# Patient Record
Sex: Male | Born: 1990 | Race: Black or African American | Hispanic: No | Marital: Single | Smoking: Never smoker
Health system: Southern US, Community
[De-identification: ages and names within clinical notes are randomized; demographics above are authoritative.]

## PROBLEM LIST (undated history)

## (undated) DIAGNOSIS — S8290XA Unspecified fracture of unspecified lower leg, initial encounter for closed fracture: Secondary | ICD-10-CM

## (undated) DIAGNOSIS — S8990XA Unspecified injury of unspecified lower leg, initial encounter: Secondary | ICD-10-CM

## (undated) DIAGNOSIS — Z973 Presence of spectacles and contact lenses: Secondary | ICD-10-CM

## (undated) HISTORY — PX: NO PAST SURGERIES: SHX2092

---
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
## 2015-11-16 ENCOUNTER — Emergency Department: Payer: 59

## 2015-11-16 ENCOUNTER — Emergency Department
Admission: EM | Admit: 2015-11-16 | Discharge: 2015-11-16 | Disposition: A | Discharge status: Home / Self Care | Payer: 59 | Attending: Emergency Medicine | Admitting: Emergency Medicine

## 2015-11-16 DIAGNOSIS — S86912A Strain of unspecified muscle(s) and tendon(s) at lower leg level, left leg, initial encounter: Secondary | ICD-10-CM | POA: Diagnosis not present

## 2015-11-16 DIAGNOSIS — S79922A Unspecified injury of left thigh, initial encounter: Secondary | ICD-10-CM | POA: Insufficient documentation

## 2015-11-16 DIAGNOSIS — Y998 Other external cause status: Secondary | ICD-10-CM | POA: Diagnosis not present

## 2015-11-16 DIAGNOSIS — R1032 Left lower quadrant pain: Secondary | ICD-10-CM

## 2015-11-16 DIAGNOSIS — Y92410 Unspecified street and highway as the place of occurrence of the external cause: Secondary | ICD-10-CM | POA: Insufficient documentation

## 2015-11-16 DIAGNOSIS — Y9389 Activity, other specified: Secondary | ICD-10-CM | POA: Diagnosis not present

## 2015-11-16 DIAGNOSIS — S8992XA Unspecified injury of left lower leg, initial encounter: Secondary | ICD-10-CM | POA: Diagnosis present

## 2015-11-16 MED ORDER — CYCLOBENZAPRINE HCL 5 MG PO TABS: 5.0000 mg | ORAL_TABLET | Freq: Three times a day (TID) | ORAL | Status: DC | PRN

## 2015-11-16 MED ORDER — CYCLOBENZAPRINE HCL 10 MG PO TABS
5.0000 mg | ORAL_TABLET | Freq: Once | ORAL | Status: AC
  Administered 2015-11-16: 5 mg via ORAL
  Filled 2015-11-16: qty 1

## 2015-11-16 MED ORDER — HYDROCODONE-ACETAMINOPHEN 5-325 MG PO TABS
1.0000 | ORAL_TABLET | Freq: Once | ORAL | Status: AC
  Administered 2015-11-16: 1 via ORAL
  Filled 2015-11-16: qty 1

## 2015-11-16 MED ORDER — HYDROCODONE-ACETAMINOPHEN 5-325 MG PO TABS: 1.0000 | ORAL_TABLET | Freq: Three times a day (TID) | ORAL | Status: DC | PRN

## 2015-11-16 MED ORDER — NAPROXEN 500 MG PO TBEC: 500.0000 mg | DELAYED_RELEASE_TABLET | Freq: Two times a day (BID) | ORAL | Status: DC
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
## 2015-11-20 ENCOUNTER — Ambulatory Visit: Payer: Self-pay | Admitting: Family Medicine

## 2015-11-20 ENCOUNTER — Ambulatory Visit (INDEPENDENT_AMBULATORY_CARE_PROVIDER_SITE_OTHER): Payer: 59

## 2015-11-20 ENCOUNTER — Ambulatory Visit
Admission: EM | Admit: 2015-11-20 | Discharge: 2015-11-20 | Disposition: A | Discharge status: Home / Self Care | Payer: 59 | Attending: Family Medicine | Admitting: Family Medicine

## 2015-11-20 ENCOUNTER — Encounter: Payer: Self-pay | Admitting: Emergency Medicine

## 2015-11-20 DIAGNOSIS — S338XXD Sprain of other parts of lumbar spine and pelvis, subsequent encounter: Secondary | ICD-10-CM | POA: Diagnosis not present

## 2015-11-20 DIAGNOSIS — T148 Other injury of unspecified body region: Secondary | ICD-10-CM | POA: Diagnosis not present

## 2015-11-20 DIAGNOSIS — S39012D Strain of muscle, fascia and tendon of lower back, subsequent encounter: Secondary | ICD-10-CM

## 2015-11-20 DIAGNOSIS — S29009D Unspecified injury of muscle and tendon of unspecified wall of thorax, subsequent encounter: Secondary | ICD-10-CM

## 2015-11-20 DIAGNOSIS — S29019D Strain of muscle and tendon of unspecified wall of thorax, subsequent encounter: Secondary | ICD-10-CM

## 2015-11-20 DIAGNOSIS — T148XXA Other injury of unspecified body region, initial encounter: Secondary | ICD-10-CM

## 2015-11-20 HISTORY — DX: Unspecified fracture of unspecified lower leg, initial encounter for closed fracture: S82.90XA

## 2015-11-20 MED ORDER — CYCLOBENZAPRINE HCL 5 MG PO TABS: 5.0000 mg | ORAL_TABLET | Freq: Three times a day (TID) | ORAL | Status: DC | PRN

## 2015-11-20 MED ORDER — MELOXICAM 15 MG PO TABS: 15.0000 mg | ORAL_TABLET | Freq: Every day | ORAL | Status: DC | PRN
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
## 2015-12-10 ENCOUNTER — Other Ambulatory Visit: Payer: Self-pay | Admitting: Surgery

## 2015-12-10 DIAGNOSIS — M25562 Pain in left knee: Secondary | ICD-10-CM

## 2015-12-12 ENCOUNTER — Ambulatory Visit
Admission: RE | Admit: 2015-12-12 | Discharge: 2015-12-12 | Disposition: A | Discharge status: Home / Self Care | Payer: 59 | Source: Ambulatory Visit | Attending: Surgery | Admitting: Surgery

## 2015-12-12 DIAGNOSIS — M25562 Pain in left knee: Secondary | ICD-10-CM | POA: Diagnosis present

## 2015-12-12 DIAGNOSIS — M7052 Other bursitis of knee, left knee: Secondary | ICD-10-CM | POA: Diagnosis not present

## 2015-12-12 DIAGNOSIS — D1620 Benign neoplasm of long bones of unspecified lower limb: Secondary | ICD-10-CM | POA: Insufficient documentation

## 2016-01-03 DIAGNOSIS — S83412D Sprain of medial collateral ligament of left knee, subsequent encounter: Secondary | ICD-10-CM | POA: Diagnosis not present

## 2016-01-03 DIAGNOSIS — M25862 Other specified joint disorders, left knee: Secondary | ICD-10-CM | POA: Diagnosis not present

## 2016-04-21 ENCOUNTER — Emergency Department
Admission: EM | Admit: 2016-04-21 | Discharge: 2016-04-21 | Disposition: A | Discharge status: Home / Self Care | Payer: 59 | Attending: Emergency Medicine | Admitting: Emergency Medicine

## 2016-04-21 ENCOUNTER — Emergency Department: Payer: 59

## 2016-04-21 DIAGNOSIS — S99911A Unspecified injury of right ankle, initial encounter: Secondary | ICD-10-CM | POA: Diagnosis not present

## 2016-04-21 DIAGNOSIS — Y929 Unspecified place or not applicable: Secondary | ICD-10-CM | POA: Insufficient documentation

## 2016-04-21 DIAGNOSIS — S83421A Sprain of lateral collateral ligament of right knee, initial encounter: Secondary | ICD-10-CM | POA: Diagnosis not present

## 2016-04-21 DIAGNOSIS — Y9367 Activity, basketball: Secondary | ICD-10-CM | POA: Insufficient documentation

## 2016-04-21 DIAGNOSIS — Z791 Long term (current) use of non-steroidal anti-inflammatories (NSAID): Secondary | ICD-10-CM | POA: Diagnosis not present

## 2016-04-21 DIAGNOSIS — X501XXA Overexertion from prolonged static or awkward postures, initial encounter: Secondary | ICD-10-CM | POA: Diagnosis not present

## 2016-04-21 DIAGNOSIS — M25571 Pain in right ankle and joints of right foot: Secondary | ICD-10-CM | POA: Diagnosis not present

## 2016-04-21 DIAGNOSIS — Y999 Unspecified external cause status: Secondary | ICD-10-CM | POA: Diagnosis not present

## 2016-04-21 DIAGNOSIS — M25561 Pain in right knee: Secondary | ICD-10-CM | POA: Diagnosis present

## 2016-04-21 MED ORDER — MELOXICAM 15 MG PO TABS: 15.0000 mg | ORAL_TABLET | Freq: Every day | ORAL | Status: DC

## 2016-04-21 MED ORDER — TRAMADOL HCL 50 MG PO TABS: 50.0000 mg | ORAL_TABLET | Freq: Four times a day (QID) | ORAL | Status: DC | PRN

## 2016-04-21 NOTE — ED Provider Notes (Signed)
[facility] Emergency Department Provider Note ____________________________________________  Time seen: Approximately 10:10 PM  I have reviewed the triage vital signs and the nursing notes.   HISTORY  Chief Complaint Knee Pain    HPI Jacob Castillo is a 25 y.o. male resents to the emergency department for evaluation of right knee pain. All playing basketball, he just turned his body to run and felt a pop and sudden onset of pain. He denies previous knee injury. He has applied a knee sleeve which has not helped the pain. He has not taken any medications for pain.  Past Medical History  Diagnosis Date  . Leg fracture     left leg, 2010    There are no active problems to display for this patient.   No past surgical history on file.  Current Outpatient Rx  Name  Route  Sig  Dispense  Refill  . cyclobenzaprine (FLEXERIL) 5 MG tablet   Oral   Take 1 tablet (5 mg total) by mouth 3 (three) times daily as needed for muscle spasms.   15 tablet   0   . cyclobenzaprine (FLEXERIL) 5 MG tablet   Oral   Take 1 tablet (5 mg total) by mouth every 8 (eight) hours as needed for muscle spasms (or pain. Do not drive or operate machinery while taking as can cause drowsiness.).   10 tablet   0   . meloxicam (MOBIC) 15 MG tablet   Oral   Take 1 tablet (15 mg total) by mouth daily.   30 tablet   0   . naproxen (EC NAPROSYN) 500 MG EC tablet   Oral   Take 1 tablet (500 mg total) by mouth 2 (two) times daily with a meal.   30 tablet   0   . traMADol (ULTRAM) 50 MG tablet   Oral   Take 1 tablet (50 mg total) by mouth every 6 (six) hours as needed.   12 tablet   0     Allergies Review of patient's allergies indicates no known allergies.  No family history on file.  Social History Social History  Substance Use Topics  . Smoking status: Never Smoker   . Smokeless tobacco: Not on file  . Alcohol Use: No     Comment: on occiasion    Review of  Systems Constitutional: No recent illness. Musculoskeletal: Pain in Right lateral knee. Skin: Negative for rash, lesion, or wound. Neurological: Negative for focal weakness or numbness. ____________________________________________   PHYSICAL EXAM:  VITAL SIGNS: ED Triage Vitals  Enc Vitals Group     BP 04/21/16 2046 118/65 mmHg     Pulse Rate 04/21/16 2046 79     Resp 04/21/16 2046 18     Temp 04/21/16 2046 98.6 F (37 C)     Temp Source 04/21/16 2046 Oral     SpO2 04/21/16 2046 98 %     Weight 04/21/16 2046 200 lb (90.719 kg)     Height 04/21/16 2046 5\' 7"  (1.702 m)     Head Cir --      Peak Flow --      Pain Score 04/21/16 2046 0     Pain Loc --      Pain Edu? --      Excl. in [place]? --     Constitutional: Alert and oriented. Well appearing and in no acute distress. Eyes: Conjunctivae are normal. EOMI. Respiratory: Normal respiratory effort.   Musculoskeletal: Right knee pain reported with  full extension. No pain on full flexion. Tenderness noted over the lateral collateral ligament of the right knee.  Skin:  Skin is warm, dry and intact. Atraumatic. Psychiatric: Mood and affect are normal. Speech and behavior are normal.  ____________________________________________   LABS (all labs ordered are listed, but only abnormal results are displayed)  Labs Reviewed - No data to display ____________________________________________  RADIOLOGY  Right knee film negative for acute bony abnormality per radiology. ____________________________________________   PROCEDURES  Procedure(s) performed:  Ace bandage was applied to the right knee by ER tech. Crutches were given with training.   ____________________________________________   INITIAL IMPRESSION / ASSESSMENT AND PLAN / ED COURSE  Pertinent labs & imaging results that were available during my care of the patient were reviewed by me and considered in my medical decision making (see chart for details).  Patient was  instructed to use his crutches and avoid weight bearing for the next 3 days. He was advised to continue using the crutches and the knee sleeve her pain persists after 3 days. He was instructed to call and schedule follow-up appointment with orthopedics. He'll be given a prescription for meloxicam and tramadol tonight. ____________________________________________   FINAL CLINICAL IMPRESSION(S) / ED DIAGNOSES  Final diagnoses:  Lateral collateral ligament sprain of knee, right, initial encounter       Victorino Dike, FNP 04/21/16 2305  Orbie Pyo, MD 04/21/16 2326

## 2016-04-21 NOTE — ED Notes (Signed)
Pt in with co right knee pain since playing basketball today, states was twisting when felt sharp pain.

## 2016-04-21 NOTE — Discharge Instructions (Signed)
Wear your knee brace and use your crutches for the next 3 days. If you still have pain when bearing weight, continue to use the crutches and schedule a follow up with orthopedics. Return to the ER for symptoms that change or worsen if you are unable to see your PCP or orthopedics.

## 2016-04-21 NOTE — ED Notes (Signed)
Pt in via triage with complaints of right knee pain.  Pt reports turning and hearing a "pop" while playing basketball tonight.  Pt reports he is unable to bear wt on that leg.  No immediate distress at this time.

## 2016-04-27 DIAGNOSIS — M2391 Unspecified internal derangement of right knee: Secondary | ICD-10-CM | POA: Diagnosis not present

## 2016-04-27 DIAGNOSIS — S8391XA Sprain of unspecified site of right knee, initial encounter: Secondary | ICD-10-CM | POA: Diagnosis not present

## 2016-04-28 ENCOUNTER — Other Ambulatory Visit: Payer: Self-pay | Admitting: Orthopedic Surgery

## 2016-04-28 DIAGNOSIS — M2391 Unspecified internal derangement of right knee: Secondary | ICD-10-CM

## 2016-04-28 DIAGNOSIS — S8391XA Sprain of unspecified site of right knee, initial encounter: Secondary | ICD-10-CM

## 2016-05-01 ENCOUNTER — Ambulatory Visit
Admission: RE | Admit: 2016-05-01 | Discharge: 2016-05-01 | Disposition: A | Discharge status: Home / Self Care | Payer: 59 | Source: Ambulatory Visit | Attending: Orthopedic Surgery | Admitting: Orthopedic Surgery

## 2016-05-01 DIAGNOSIS — M2391 Unspecified internal derangement of right knee: Secondary | ICD-10-CM

## 2016-05-01 DIAGNOSIS — S83251A Bucket-handle tear of lateral meniscus, current injury, right knee, initial encounter: Secondary | ICD-10-CM | POA: Diagnosis not present

## 2016-05-01 DIAGNOSIS — S8391XA Sprain of unspecified site of right knee, initial encounter: Secondary | ICD-10-CM

## 2016-05-01 DIAGNOSIS — M25561 Pain in right knee: Secondary | ICD-10-CM | POA: Diagnosis not present

## 2016-05-05 ENCOUNTER — Encounter: Payer: Self-pay | Admitting: Physical Therapy

## 2016-05-05 ENCOUNTER — Ambulatory Visit: Payer: 59 | Attending: Orthopedic Surgery | Admitting: Physical Therapy

## 2016-05-05 DIAGNOSIS — M25561 Pain in right knee: Secondary | ICD-10-CM | POA: Diagnosis not present

## 2016-05-05 DIAGNOSIS — M25661 Stiffness of right knee, not elsewhere classified: Secondary | ICD-10-CM | POA: Insufficient documentation

## 2016-05-05 NOTE — Therapy (Signed)
[facility] [address], [place], [address] Phone: [phone]   Fax:  [phone]  Physical Therapy Treatment  Patient Details  Name: Jacob Castillo MRN: [id number] Date of Birth: 12-May-1991 Referring Provider: Juliane Lack, PA  Encounter Date: 05/05/2016      PT End of Session - 05/05/16 1508    Visit Number 1   Number of Visits 12   Date for PT Re-Evaluation 06/16/16   PT Start Time 0941   PT Stop Time 1030   PT Time Calculation (min) 49 min   Activity Tolerance Patient tolerated treatment well   Behavior During Therapy [facility] for tasks assessed/performed      Past Medical History  Diagnosis Date  . Leg fracture     left leg, 2010    History reviewed. No pertinent past surgical history.  There were no vitals filed for this visit.      Subjective Assessment - 05/05/16 0949    Subjective Patient experienced R knee instability and pain when quickly turning to the right when playing basketball and states he felt a pain along the side of the right knee resulting in instant swelling on 04/21/16; pt does not report hearing a pop. States he's having difficulty with extending out the knee, walking, standing (requires to bear weight mostly onto left LE). Pt states the pain feels better after ice and elevation and performs this 2 x day. Pt reports symptoms have been improving since the incident. Worse pain in the past week has been a 6/10 and the best has been a 2/10 pain.    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   Currently in Pain? Yes    Pain Score 2    Pain Location Knee   Pain Orientation Right   Pain Descriptors / Indicators Aching   Pain Type Acute pain   Pain Radiating Towards N/A   Pain Onset 1 to 4 weeks ago   Pain Frequency Intermittent   Aggravating Factors  Weight bearing positions   Pain Relieving Factors ice, elevation            [facility] PT Assessment - 05/05/16 0944    Assessment   Medical Diagnosis sprain of right knee/leg, initial encounter [id number]), Internal derangement of knee, acute, right (M23.91)   Referring Provider Juliane Lack, PA   Onset Date/Surgical Date 04/21/16   Hand Dominance Right   Next MD Visit unknown   Prior Therapy none   Precautions   Precautions None   Restrictions   Weight Bearing Restrictions No   Balance Screen   Has the patient fallen in the past 6 months No   Has the patient had a decrease in activity level because of a fear of falling?  No   Is the patient reluctant to leave their home because of a fear of falling?  No   Home Ecologist residence   Living Arrangements Parent   Type of [place] to enter   Entrance Stairs-Number of Steps 8   Entrance Stairs-Rails Right   [place] One level  Prior Function   Level of Independence Independent   Vocation Full time employment   Vocation Requirements Walking, standing, lifting, stocking shelves.   Leisure Exercising, weight lfiting, running, basketball   Cognition   Overall Cognitive Status Within Functional Limits for tasks assessed      Objective: Observation: Standing posture: Static knee flexion throughout stance with weight shifting onto L LE; Ambulation: Static knee flexion throughout gait, decreased stance time on R LE; minor pocket swelling along lateral aspect of R knee Palpation: Minor joint line tenderness along lateral aspect of the knee,   Measurement:  R Hip AROM/MMT: Flexion: WNL, 4-/5, Extension:WNL, 4-/5 , ABD: WNL, 4-/5 ,  ADD:4-/5, ER: WNL L Hip AROM/MMT: Flexion: WNL, Extension: WNL, ABD: WNL, ADD:4-/5, ER: WNL, 4-/5 R Knee AROM/MMT: Flexion:125/ 4-/5, Extension: 15 from neutral L Knee AROM/MMT: Flexion:WNL, Extension:WNL  Quad activation on R: poor-fair; left WNL  Special Testing: (+): Thessalys on Right (-): Eges, McMurrays, anterior drawer, Valgus stress, varus stress test -- bilaterally  Outcome Measures:  LEFS: 40/80   Therapeutic Exercise: Patient performed exercises with guidance, verbal and tactile cues and demonstration of therapist: Quad sets in supine with towel under knee -- x 15 Ball squeeze glute squeeze -- x 15 Hip abduction against green band -- x 15 Ball roll outs in sitting -- 1 min  Patient response to treatment: Improved gait with increased stance time and knee extension throughout after performing therapeutic exercise indicating decreased muscular guarding. Patient reported decreased stiffness in the knee after therapeutic exercise. He continues with decreased full knee extension and decreased weight shift to left during ambulation.           PT Education - 05/05/16 1458    Education provided Yes   Education Details HEP: ball roll outs in sitting, glute squeeze/hip adduction against ball, seated clamshells with band, quad sets; educated to continue ice and eleavation.   Person(s) Educated Patient   Methods Explanation;Demonstration   Comprehension Verbalized understanding;Returned demonstration             PT Long Term Goals - 05/05/16 1509    PT LONG TERM GOAL #1   Title Pt will reports a LEFS score of 50/80 by 06/16/2016 to demonstrate significant improvement in LE function and improved ability to stand without aggravation of pain.    Baseline LEFS: 40/80   Status New   PT LONG TERM GOAL #2   Title Pt will indpendent with exercise performance and progression  in HEP focusing on improving knee AROM, motor control, propioception, and endurance by 06/16/16 to  continue functional gains after therapy in completed   Baseline Dependent with exercise performance and progression.    Status New               Plan - 05/05/16 1523    Clinical Impression Statement Pt is a 25 yo right hand dominant male experiencing R knee pain and stiffness along the lateral joint line after planting with the R LE and turning to the right when playing basketball. Pt is experiencing a decrease in function as demonstrated by a 40/80 LEFS score (moderate self perceived LE dysfunction), extensor lag by 15 degrees, and decreased ability to perform ambulation, standing, and weight bearing activities without difficulty. Pt demonstrates decreased motor control, muscular endurance, coordination, and strength in the knee during exercise performance and will benefit from further skilled therapy to increase functional mobility, if indicated, following evaluation and consult with surgeon.    Rehab Potential Good  Clinical Impairments Affecting Rehab Potential (+): highly motivated, family support   PT Frequency 2x / week   PT Duration 6 weeks   PT Treatment/Interventions Moist Heat   PT Next Visit Plan Increasing knee AROM and quad activation   PT Home Exercise Plan Quad sets, ball roll outs for ROM, hip adduction, hip abduction in sitting   Consulted and Agree with Plan of Care Patient      Patient will benefit from skilled therapeutic intervention in order to improve the following deficits and impairments:  Pain, Impaired flexibility, Increased muscle spasms, Difficulty walking, Hypomobility, Increased edema, Decreased strength, Decreased mobility, Decreased balance, Decreased activity tolerance, Decreased endurance, Decreased coordination  Visit Diagnosis: Stiffness of right knee, not elsewhere classified  Pain in right knee     Problem List There are no active problems to display for this patient.   Blythe Stanford, SPT 05/05/2016, 5:51 PM  [facility] [address] [place], [address] Phone: [phone]   Fax:  [phone]  Name: Jacob Castillo MRN: [id number] Date of Birth: 11/23/1991

## 2016-05-07 ENCOUNTER — Encounter: Payer: 59 | Admitting: Physical Therapy

## 2016-05-11 DIAGNOSIS — S83281A Other tear of lateral meniscus, current injury, right knee, initial encounter: Secondary | ICD-10-CM | POA: Diagnosis not present

## 2016-05-12 ENCOUNTER — Encounter: Payer: Self-pay | Admitting: *Deleted

## 2016-05-12 ENCOUNTER — Ambulatory Visit: Payer: 59

## 2016-05-13 ENCOUNTER — Encounter
Admission: RE | Disposition: A | Discharge status: Home / Self Care | Payer: Self-pay | Source: Ambulatory Visit | Attending: Surgery

## 2016-05-13 ENCOUNTER — Ambulatory Visit: Payer: 59 | Admitting: Anesthesiology

## 2016-05-13 ENCOUNTER — Encounter: Payer: 59 | Admitting: Physical Therapy

## 2016-05-13 ENCOUNTER — Encounter: Payer: Self-pay | Admitting: *Deleted

## 2016-05-13 ENCOUNTER — Ambulatory Visit
Admission: RE | Admit: 2016-05-13 | Discharge: 2016-05-13 | Disposition: A | Discharge status: Home / Self Care | Payer: 59 | Source: Ambulatory Visit | Attending: Surgery | Admitting: Surgery

## 2016-05-13 DIAGNOSIS — S83251A Bucket-handle tear of lateral meniscus, current injury, right knee, initial encounter: Secondary | ICD-10-CM | POA: Insufficient documentation

## 2016-05-13 DIAGNOSIS — Z8249 Family history of ischemic heart disease and other diseases of the circulatory system: Secondary | ICD-10-CM | POA: Diagnosis not present

## 2016-05-13 DIAGNOSIS — X509XXA Other and unspecified overexertion or strenuous movements or postures, initial encounter: Secondary | ICD-10-CM | POA: Insufficient documentation

## 2016-05-13 DIAGNOSIS — S83281A Other tear of lateral meniscus, current injury, right knee, initial encounter: Secondary | ICD-10-CM | POA: Diagnosis not present

## 2016-05-13 DIAGNOSIS — Z79899 Other long term (current) drug therapy: Secondary | ICD-10-CM | POA: Diagnosis not present

## 2016-05-13 DIAGNOSIS — Y9367 Activity, basketball: Secondary | ICD-10-CM | POA: Diagnosis not present

## 2016-05-13 DIAGNOSIS — X500XXA Overexertion from strenuous movement or load, initial encounter: Secondary | ICD-10-CM | POA: Diagnosis not present

## 2016-05-13 DIAGNOSIS — S83259A Bucket-handle tear of lateral meniscus, current injury, unspecified knee, initial encounter: Secondary | ICD-10-CM | POA: Diagnosis present

## 2016-05-13 HISTORY — PX: KNEE ARTHROSCOPY: SHX127

## 2016-05-13 HISTORY — DX: Presence of spectacles and contact lenses: Z97.3

## 2016-05-13 HISTORY — DX: Unspecified injury of unspecified lower leg, initial encounter: S89.90XA

## 2016-05-13 SURGERY — ARTHROSCOPY, KNEE
Anesthesia: General | Site: Knee | Laterality: Right | Wound class: Clean

## 2016-05-13 MED ORDER — LIDOCAINE HCL (CARDIAC) 20 MG/ML IV SOLN
INTRAVENOUS | Status: DC | PRN
Start: 2016-05-13 — End: 2016-05-13
  Administered 2016-05-13: 30 mg via INTRATRACHEAL

## 2016-05-13 MED ORDER — DEXAMETHASONE SODIUM PHOSPHATE 4 MG/ML IJ SOLN
INTRAMUSCULAR | Status: DC | PRN
  Administered 2016-05-13: 8 mg via INTRAVENOUS

## 2016-05-13 MED ORDER — GLYCOPYRROLATE 0.2 MG/ML IJ SOLN
INTRAMUSCULAR | Status: DC | PRN
  Administered 2016-05-13: 0.2 mg via INTRAVENOUS

## 2016-05-13 MED ORDER — METOCLOPRAMIDE HCL 5 MG PO TABS: 5.0000 mg | ORAL_TABLET | Freq: Three times a day (TID) | ORAL | Status: DC | PRN

## 2016-05-13 MED ORDER — FENTANYL CITRATE (PF) 100 MCG/2ML IJ SOLN
INTRAMUSCULAR | Status: DC | PRN
  Administered 2016-05-13: 100 ug via INTRAVENOUS

## 2016-05-13 MED ORDER — FENTANYL CITRATE (PF) 100 MCG/2ML IJ SOLN
25.0000 ug | INTRAMUSCULAR | Status: DC | PRN
  Administered 2016-05-13: 25 ug via INTRAVENOUS

## 2016-05-13 MED ORDER — ONDANSETRON HCL 4 MG/2ML IJ SOLN: 4.0000 mg | Freq: Four times a day (QID) | INTRAMUSCULAR | Status: DC | PRN

## 2016-05-13 MED ORDER — OXYCODONE HCL 5 MG/5ML PO SOLN: 5.0000 mg | Freq: Once | ORAL | Status: AC | PRN

## 2016-05-13 MED ORDER — METOCLOPRAMIDE HCL 5 MG/ML IJ SOLN: 5.0000 mg | Freq: Three times a day (TID) | INTRAMUSCULAR | Status: DC | PRN

## 2016-05-13 MED ORDER — HYDROCODONE-ACETAMINOPHEN 5-325 MG PO TABS: 1.0000 | ORAL_TABLET | Freq: Four times a day (QID) | ORAL | Status: AC | PRN

## 2016-05-13 MED ORDER — POTASSIUM CHLORIDE IN NACL 20-0.9 MEQ/L-% IV SOLN
INTRAVENOUS | Status: DC
Start: 2016-05-13 — End: 2016-05-13

## 2016-05-13 MED ORDER — LIDOCAINE HCL 1 % IJ SOLN
INTRAMUSCULAR | Status: DC | PRN
  Administered 2016-05-13: 60 mL via INTRAMUSCULAR

## 2016-05-13 MED ORDER — PROPOFOL 10 MG/ML IV BOLUS
INTRAVENOUS | Status: DC | PRN
  Administered 2016-05-13: 200 mg via INTRAVENOUS

## 2016-05-13 MED ORDER — PROMETHAZINE HCL 25 MG/ML IJ SOLN: 6.2500 mg | INTRAMUSCULAR | Status: DC | PRN

## 2016-05-13 MED ORDER — OXYCODONE HCL 5 MG PO TABS
5.0000 mg | ORAL_TABLET | Freq: Once | ORAL | Status: AC | PRN
  Administered 2016-05-13: 5 mg via ORAL

## 2016-05-13 MED ORDER — CEFAZOLIN SODIUM-DEXTROSE 2-4 GM/100ML-% IV SOLN
2.0000 g | Freq: Once | INTRAVENOUS | Status: AC
  Administered 2016-05-13: 2 g via INTRAVENOUS

## 2016-05-13 MED ORDER — LACTATED RINGERS IV SOLN
INTRAVENOUS | Status: DC
  Administered 2016-05-13: 11:00:00 via INTRAVENOUS

## 2016-05-13 MED ORDER — ONDANSETRON HCL 4 MG PO TABS: 4.0000 mg | ORAL_TABLET | Freq: Four times a day (QID) | ORAL | Status: DC | PRN

## 2016-05-13 MED ORDER — BUPIVACAINE-EPINEPHRINE (PF) 0.5% -1:200000 IJ SOLN
INTRAMUSCULAR | Status: DC | PRN
  Administered 2016-05-13: 30 mL

## 2016-05-13 MED ORDER — ONDANSETRON HCL 4 MG/2ML IJ SOLN
INTRAMUSCULAR | Status: DC | PRN
  Administered 2016-05-13: 4 mg via INTRAVENOUS

## 2016-05-13 MED ORDER — MIDAZOLAM HCL 5 MG/5ML IJ SOLN
INTRAMUSCULAR | Status: DC | PRN
  Administered 2016-05-13: 2 mg via INTRAVENOUS

## 2016-05-13 MED ORDER — KETOROLAC TROMETHAMINE 30 MG/ML IJ SOLN
INTRAMUSCULAR | Status: DC | PRN
  Administered 2016-05-13: 30 mg via INTRAVENOUS

## 2016-05-13 MED ORDER — HYDROCODONE-ACETAMINOPHEN 5-325 MG PO TABS: 1.0000 | ORAL_TABLET | ORAL | Status: DC | PRN

## 2016-05-13 SURGICAL SUPPLY — 33 items
BANDAGE ELASTIC 6 VELCRO NS (GAUZE/BANDAGES/DRESSINGS) ×3 IMPLANT
BLADE FULL RADIUS 3.5 (BLADE) ×3 IMPLANT
BUR ACROMIONIZER 4.0 (BURR) IMPLANT
CHLORAPREP W/TINT 26ML (MISCELLANEOUS) ×6 IMPLANT
COVER LIGHT HANDLE UNIVERSAL (MISCELLANEOUS) ×6 IMPLANT
CUFF TOURN SGL QUICK 30 (MISCELLANEOUS) ×2
CUFF TOURN SGL QUICK 34 (TOURNIQUET CUFF)
CUFF TRNQT CYL 34X4X40X1 (TOURNIQUET CUFF) IMPLANT
CUFF TRNQT CYL LO 30X4X (MISCELLANEOUS) ×1 IMPLANT
DRAPE IMP U-DRAPE 54X76 (DRAPES) ×3 IMPLANT
GAUZE SPONGE 4X4 12PLY STRL (GAUZE/BANDAGES/DRESSINGS) ×3 IMPLANT
GLOVE BIO SURGEON STRL SZ8 (GLOVE) ×6 IMPLANT
GLOVE INDICATOR 8.0 STRL GRN (GLOVE) ×3 IMPLANT
GOWN STRL REUS W/ TWL LRG LVL3 (GOWN DISPOSABLE) ×1 IMPLANT
GOWN STRL REUS W/ TWL XL LVL3 (GOWN DISPOSABLE) ×1 IMPLANT
GOWN STRL REUS W/TWL LRG LVL3 (GOWN DISPOSABLE) ×2
GOWN STRL REUS W/TWL XL LVL3 (GOWN DISPOSABLE) ×2
IV LACTATED RINGER IRRG 3000ML (IV SOLUTION) ×2
IV LR IRRIG 3000ML ARTHROMATIC (IV SOLUTION) ×1 IMPLANT
KIT ROOM TURNOVER OR (KITS) ×3 IMPLANT
KNOT PUSHER/SUTURE CUTTER ×2 IMPLANT
MANIFOLD 4PT FOR NEPTUNE1 (MISCELLANEOUS) ×3 IMPLANT
NEEDLE HYPO 21X1.5 SAFETY (NEEDLE) ×6 IMPLANT
PACK ARTHROSCOPY KNEE (MISCELLANEOUS) ×3 IMPLANT
POST OP SHORT OSM ×3 IMPLANT
PUSHER KNOT ARTHRO STRT FASTFI (Miscellaneous) ×9 IMPLANT
STRAP BODY AND KNEE 60X3 (MISCELLANEOUS) ×3 IMPLANT
SUT PROLENE 4 0 PS 2 18 (SUTURE) ×3 IMPLANT
SUT VIC AB 2-0 CT1 27 (SUTURE)
SUT VIC AB 2-0 CT1 TAPERPNT 27 (SUTURE) IMPLANT
SYR 50ML LL SCALE MARK (SYRINGE) ×3 IMPLANT
TUBING ARTHRO INFLOW-ONLY STRL (TUBING) ×3 IMPLANT
WAND HAND CNTRL MULTIVAC 90 (MISCELLANEOUS) ×3 IMPLANT

## 2016-05-13 NOTE — Discharge Instructions (Addendum)
General Anesthesia, Adult, Care After Refer to this sheet in the next few weeks. These instructions provide you with information on caring for yourself after your procedure. Your health care provider may also give you more specific instructions. Your treatment has been planned according to current medical practices, but problems sometimes occur. Call your health care provider if you have any problems or questions after your procedure. WHAT TO EXPECT AFTER THE PROCEDURE After the procedure, it is typical to experience:  Sleepiness.  Nausea and vomiting. HOME CARE INSTRUCTIONS  For the first 24 hours after general anesthesia:  Have a responsible person with you.  Do not drive a car. If you are alone, do not take public transportation.  Do not drink alcohol.  Do not take medicine that has not been prescribed by your health care provider.  Do not sign important papers or make important decisions.  You may resume a normal diet and activities as directed by your health care provider.  Change bandages (dressings) as directed.  If you have questions or problems that seem related to general anesthesia, call the hospital and ask for the anesthetist or anesthesiologist on call. SEEK MEDICAL CARE IF:  You have nausea and vomiting that continue the day after anesthesia.  You develop a rash. SEEK IMMEDIATE MEDICAL CARE IF:   You have difficulty breathing.  You have chest pain.  You have any allergic problems.   This information is not intended to replace advice given to you by your health care provider. Make sure you discuss any questions you have with your health care provider.   Document Released: 03/15/2001 Document Revised: 12/28/2014 Document Reviewed: 04/06/2012 Elsevier Interactive Patient Education 2016 [employer].  Keep dressing dry and intact.  May shower after dressing changed on post-op day #4 (Sunday).  Cover stitches with Band-Aids after drying off. Apply ice  frequently to knee. Keep brace on and locked in extension except may remove for bathing purposes. Toe-touch weight-bearing only - use crutches. Follow-up in 10-14 days or as scheduled.

## 2016-05-13 NOTE — Transfer of Care (Signed)
Immediate Anesthesia Transfer of Care Note  Patient: Jacob Castillo  Procedure(s) Performed: Procedure(s) with comments: ARTHROSCOPY KNEE WITH REPAIR VS PARTIAL LATERAL MENISCECTOMY (Right) - FAST FIX 360"S ZONE SPECIFIC CANNULAS FOR AN INSIDE -OUT REPAIR  Patient Location: PACU  Anesthesia Type: General  Level of Consciousness: awake, alert  and patient cooperative  Airway and Oxygen Therapy: Patient Spontanous Breathing and Patient connected to supplemental oxygen  Post-op Assessment: Post-op Vital signs reviewed, Patient's Cardiovascular Status Stable, Respiratory Function Stable, Patent Airway and No signs of Nausea or vomiting  Post-op Vital Signs: Reviewed and stable  Complications: No apparent anesthesia complications

## 2016-05-13 NOTE — Op Note (Signed)
05/13/2016  12:39 PM  Patient:   Jacob Castillo  Pre-Op Diagnosis:   Displaced bucket handle lateral meniscus tear, right knee.  Postoperative diagnosis:   Same.  Procedure:   Arthroscopic repair of displaced bucket handle lateral meniscus tear, right knee.  Surgeon:   Pascal Lux, M.D.  Anesthesia:   General LMA.  Findings:   As above. The medial meniscus was in satisfactory condition, as were the anterior and posterior cruciate ligaments. There were grade 1-2 chondromalacial changes involving the weightbearing portion of the lateral femoral condyle. Otherwise, the articular surfaces of the femur, the tibia, and the patella all were in satisfactory condition.  Complications:   None.  EBL:   <5 cc.  Total fluids:   800 cc of crystalloid.  Tourniquet time:   None  Drains:   None  Closure:   4-0 Prolene interrupted sutures.  Brief clinical note:   The patient is a 25 year old male who sustained the above-noted injury several weeks ago while playing basketball. Apparently he cut sharply to his right and felt something pop in his right knee. He presented to the orthopedic clinic. An MRI scan was ordered which confirmed the above-noted findings. The patient presents at this time for arthroscopy, debridement, and repair versus partial lateral meniscectomy.  Procedure:   The patient was brought into the operating room and lain in the supine position. After adequate general laryngal mask anesthesia was obtained, a timeout was performed to verify the appropriate side. The patient's right knee was injected sterilely using a solution of 30 cc of 1% lidocaine and 30 cc of 0.5% Sensorcaine with epinephrine. The right lower extremity was prepped with ChloraPrep solution before being draped sterilely. Preoperative antibiotics were administered. The expected portal sites were injected with 0.5% Sensorcaine with epinephrine before the camera was placed in the anterolateral portal and  instrumentation performed through the anteromedial portal. The knee was sequentially examined beginning in the suprapatellar pouch, then progressing to the patellofemoral space, the medial gutter compartment, the notch, and finally the lateral compartment and gutter. The findings were as described above. Abundant reactive synovial tissues anteriorly were debrided using the full-radius resector in order to improve visualization. The displaced lateral meniscus tear was reduced and its position assessed. Several small remnants/frayed pieces were debrided before the main portion of the meniscus was repaired using three FasT-Fix 360 anchor devices. Subsequent probing of the repair demonstrated excellent stability. The instruments were removed from the joint after suctioning the excess fluid. The portal sites were closed using 4-0 Prolene interrupted sutures before a sterile bulky dressing was applied to the knee. The patient was then awakened, extubated, and returned to the recovery room in satisfactory condition after tolerating the procedure well.

## 2016-05-13 NOTE — H&P (Signed)
Paper H&P to be scanned into permanent record. H&P reviewed. No changes. 

## 2016-05-13 NOTE — Anesthesia Preprocedure Evaluation (Signed)
Anesthesia Evaluation  Patient identified by MRN, date of birth, ID band Patient awake    Reviewed: Allergy & Precautions, NPO status , Patient's Chart, lab work & pertinent test results  Airway Mallampati: II  TM Distance: >3 FB Neck ROM: Full    Dental no notable dental hx.    Pulmonary neg pulmonary ROS,    Pulmonary exam normal breath sounds clear to auscultation       Cardiovascular negative cardio ROS Normal cardiovascular exam Rhythm:Regular Rate:Normal     Neuro/Psych negative neurological ROS  negative psych ROS   GI/Hepatic negative GI ROS, Neg liver ROS,   Endo/Other  negative endocrine ROS  Renal/GU negative Renal ROS  negative genitourinary   Musculoskeletal negative musculoskeletal ROS (+)   Abdominal   Peds negative pediatric ROS (+)  Hematology negative hematology ROS (+)   Anesthesia Other Findings   Reproductive/Obstetrics negative OB ROS                             Anesthesia Physical Anesthesia Plan  ASA: I  Anesthesia Plan: General   Post-op Pain Management:    Induction: Intravenous  Airway Management Planned: LMA C-Track Planned  Additional Equipment:   Intra-op Plan:   Post-operative Plan: Extubation in OR  Informed Consent: I have reviewed the patients History and Physical, chart, labs and discussed the procedure including the risks, benefits and alternatives for the proposed anesthesia with the patient or authorized representative who has indicated his/her understanding and acceptance.   Dental advisory given  Plan Discussed with: CRNA  Anesthesia Plan Comments:         Anesthesia Quick Evaluation

## 2016-05-13 NOTE — Anesthesia Postprocedure Evaluation (Signed)
Anesthesia Post Note  Patient: Jacob Castillo  Procedure(s) Performed: Procedure(s) (LRB): Arthroscopi repair displaced bucket handle lateral meniscus tear right knee (Right)  Patient location during evaluation: PACU Anesthesia Type: General Level of consciousness: awake and alert Pain management: pain level controlled Vital Signs Assessment: post-procedure vital signs reviewed and stable Respiratory status: spontaneous breathing, nonlabored ventilation, respiratory function stable and patient connected to nasal cannula oxygen Cardiovascular status: blood pressure returned to baseline and stable Postop Assessment: no signs of nausea or vomiting Anesthetic complications: no    Ladanian Kelter C

## 2016-05-13 NOTE — Anesthesia Procedure Notes (Signed)
Procedure Name: LMA Insertion Date/Time: 05/13/2016 11:42 AM Performed by: Londell Moh Pre-anesthesia Checklist: Patient identified, Emergency Drugs available, Suction available, Timeout performed and Patient being monitored Patient Re-evaluated:Patient Re-evaluated prior to inductionOxygen Delivery Method: Circle system utilized Preoxygenation: Pre-oxygenation with 100% oxygen Intubation Type: IV induction LMA: LMA inserted LMA Size: 4.0 Number of attempts: 1 Placement Confirmation: positive ETCO2 and breath sounds checked- equal and bilateral Tube secured with: Tape

## 2016-05-14 ENCOUNTER — Encounter: Payer: Self-pay | Admitting: Surgery

## 2016-05-15 ENCOUNTER — Encounter: Payer: 59 | Admitting: Physical Therapy

## 2016-05-19 ENCOUNTER — Encounter: Payer: Self-pay | Admitting: Surgery

## 2016-05-20 ENCOUNTER — Encounter: Payer: 59 | Admitting: Physical Therapy

## 2016-05-25 ENCOUNTER — Encounter: Payer: 59 | Admitting: Physical Therapy

## 2016-05-27 ENCOUNTER — Encounter: Payer: 59 | Admitting: Physical Therapy

## 2016-06-01 ENCOUNTER — Encounter: Payer: 59 | Admitting: Physical Therapy

## 2016-06-03 ENCOUNTER — Encounter: Payer: 59 | Admitting: Physical Therapy

## 2016-06-08 ENCOUNTER — Encounter: Payer: 59 | Admitting: Physical Therapy

## 2016-06-10 ENCOUNTER — Encounter: Payer: 59 | Admitting: Physical Therapy

## 2016-06-15 ENCOUNTER — Encounter: Payer: 59 | Admitting: Physical Therapy

## 2016-06-17 ENCOUNTER — Encounter: Payer: 59 | Admitting: Physical Therapy
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
## 2016-09-15 DIAGNOSIS — H5213 Myopia, bilateral: Secondary | ICD-10-CM | POA: Diagnosis not present

## 2016-09-15 DIAGNOSIS — H52223 Regular astigmatism, bilateral: Secondary | ICD-10-CM | POA: Diagnosis not present

## 2018-01-20 DIAGNOSIS — H5213 Myopia, bilateral: Secondary | ICD-10-CM | POA: Diagnosis not present

## 2021-01-02 ENCOUNTER — Emergency Department (HOSPITAL_COMMUNITY)
Admission: EM | Admit: 2021-01-02 | Discharge: 2021-01-02 | Disposition: A | Discharge status: Home / Self Care | Payer: 59 | Attending: Emergency Medicine | Admitting: Emergency Medicine

## 2021-01-02 ENCOUNTER — Other Ambulatory Visit: Payer: Self-pay

## 2021-01-02 ENCOUNTER — Encounter (HOSPITAL_COMMUNITY): Payer: Self-pay

## 2021-01-02 DIAGNOSIS — S29012A Strain of muscle and tendon of back wall of thorax, initial encounter: Secondary | ICD-10-CM | POA: Insufficient documentation

## 2021-01-02 DIAGNOSIS — T148XXA Other injury of unspecified body region, initial encounter: Secondary | ICD-10-CM

## 2021-01-02 DIAGNOSIS — M546 Pain in thoracic spine: Secondary | ICD-10-CM | POA: Diagnosis present

## 2021-01-02 MED ORDER — METHOCARBAMOL 500 MG PO TABS: 500.0000 mg | ORAL_TABLET | Freq: Every evening | ORAL | 0 refills | Status: AC | PRN

## 2021-01-02 MED ORDER — NAPROXEN 500 MG PO TABS: 500.0000 mg | ORAL_TABLET | Freq: Two times a day (BID) | ORAL | 0 refills | Status: AC
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
## 2021-01-02 NOTE — Discharge Instructions (Signed)
Take naproxen 2 times a day with meals.  Do not take other anti-inflammatories at the same time (Advil, Motrin, ibuprofen, Aleve). You may supplement with Tylenol if you need further pain control. °Use robaxin as needed for muscle stiffness or soreness.  Have caution, this may make you tired or groggy.  Do not drive or operate heavy machinery while taking this medicine. °Use ice packs or heating pads if this helps control your pain. °You will likely have continued muscle stiffness and soreness over the next couple days.  Follow-up with primary care in 1 week if your symptoms are not improving. °Return to the emergency room if you develop vision changes, vomiting, slurred speech, numbness, loss of bowel or bladder control, or any new or worsening symptoms. ° °

## 2021-12-23 ENCOUNTER — Other Ambulatory Visit: Payer: Self-pay

## 2021-12-23 ENCOUNTER — Emergency Department (HOSPITAL_BASED_OUTPATIENT_CLINIC_OR_DEPARTMENT_OTHER)
Admission: EM | Admit: 2021-12-23 | Discharge: 2021-12-23 | Disposition: A | Discharge status: Home / Self Care | Payer: 59 | Attending: Emergency Medicine | Admitting: Emergency Medicine

## 2021-12-23 ENCOUNTER — Encounter (HOSPITAL_BASED_OUTPATIENT_CLINIC_OR_DEPARTMENT_OTHER): Payer: Self-pay

## 2021-12-23 DIAGNOSIS — K649 Unspecified hemorrhoids: Secondary | ICD-10-CM | POA: Insufficient documentation

## 2021-12-23 MED ORDER — PHENYLEPHRINE-MINERAL OIL-PET 0.25-14-74.9 % RE OINT: 1.0000 "application " | TOPICAL_OINTMENT | Freq: Two times a day (BID) | RECTAL | 0 refills | Status: AC | PRN

## 2021-12-23 NOTE — ED Notes (Signed)
Pt verbalizes understanding of discharge instructions. Opportunity for questioning and answers were provided. Pt discharged from ED to home.   ? ?

## 2021-12-23 NOTE — ED Triage Notes (Signed)
Patient here POV from Home with Blood in Stool.  Patient states Symptoms have been present for approximately 1 month and then subsided but returned more recently.   No Obvious Symptoms besides Pain when sitting.  NAD noted during Triage. Ambulatory. A&Ox4. GCS 15.

## 2021-12-23 NOTE — Discharge Instructions (Signed)
The most important thing is time on the toilet.  Try please try to limit this.  Do not read or use your phone while using the bathroom.  It also is not good to have to bear down hard.  Try Colace which is over-the-counter that can help soften your stools.  The best medication for this usually is Preparation H.  I feel that the ointment is probably the best.  This should be applied after each bowel movement and before you go to bed.  I have given you information to call and set up an appointment with a gastroenterologist.  If this persist they can do a procedure to try and remove the hemorrhoid or if they feel like that is not the cause of the bleeding they can perform a colonoscopy to look and see if there is a different cause.  Please return for worsening bleeding he feels lightheaded and dizzy like he might pass out.

## 2021-12-23 NOTE — ED Provider Notes (Signed)
[facility] EMERGENCY DEPT Provider Note   CSN: [id number] Arrival date & time: 12/23/21  1531     History  Chief Complaint  Patient presents with   Melena    Jacob Castillo is a 31 y.o. male.  31 yo M with bright red blood per rectum.  Going on for about a month off and on.  Has some discomfort to his rectum when he sits nor in certain positions.  Blood usually mixed in with stool and has noted some blood on toilet paper and has had some bleeding without having a bowel movement.  Works as a Administrator.  Denies foreign body to the rectum.  Denies fevers denies abdominal pain.  The history is provided by the patient.  Illness Severity:  Moderate Onset quality:  Gradual Duration:  1 month Timing:  Intermittent Progression:  Waxing and waning Chronicity:  New Associated symptoms: no abdominal pain, no chest pain, no congestion, no diarrhea, no fever, no headaches, no myalgias, no rash, no shortness of breath and no vomiting       Home Medications Prior to Admission medications   Medication Sig Start Date End Date Taking? Authorizing Provider  phenylephrine-shark liver oil-mineral oil-petrolatum (PREPARATION H) 0.25-14-74.9 % rectal ointment Place 1 application rectally 2 (two) times daily as needed for hemorrhoids. 12/23/21  Yes Deno Etienne, DO  HYDROcodone-acetaminophen (NORCO) 5-325 MG tablet Take 1-2 tablets by mouth every 6 (six) hours as needed for moderate pain. MAXIMUM TOTAL ACETAMINOPHEN DOSE IS 4000 MG PER DAY 05/13/16   Poggi, Marshall Cork, MD  ibuprofen (ADVIL,MOTRIN) 200 MG tablet Take 400 mg by mouth every 6 (six) hours as needed.    [provider]  methocarbamol (ROBAXIN) 500 MG tablet Take 1 tablet (500 mg total) by mouth at bedtime as needed for muscle spasms. 01/02/21   Caccavale, Sophia, PA-C  naproxen (NAPROSYN) 500 MG tablet Take 1 tablet (500 mg total) by mouth 2 (two) times daily with a meal. 01/02/21   Caccavale, Sophia, PA-C      Allergies     Patient has no known allergies.    Review of Systems   Review of Systems  Constitutional:  Negative for chills and fever.  HENT:  Negative for congestion and facial swelling.   Eyes:  Negative for discharge and visual disturbance.  Respiratory:  Negative for shortness of breath.   Cardiovascular:  Negative for chest pain and palpitations.  Gastrointestinal:  Positive for blood in stool and rectal pain. Negative for abdominal pain, diarrhea and vomiting.  Musculoskeletal:  Negative for arthralgias and myalgias.  Skin:  Negative for color change and rash.  Neurological:  Negative for tremors, syncope and headaches.  Psychiatric/Behavioral:  Negative for confusion and dysphoric mood.    Physical Exam Updated Vital Signs BP 114/82    Pulse 86    Temp 98 F (36.7 C)    Resp 18    Ht 5\' 7"  (1.702 m)    Wt 99.8 kg    SpO2 99%    BMI 34.46 kg/m  Physical Exam Vitals and nursing note reviewed.  Constitutional:      Appearance: He is well-developed.  HENT:     Head: Normocephalic and atraumatic.  Eyes:     Pupils: Pupils are equal, round, and reactive to light.  Neck:     Vascular: No JVD.  Cardiovascular:     Rate and Rhythm: Normal rate and regular rhythm.     Heart sounds: No murmur heard.  No friction rub. No gallop.  Pulmonary:     Effort: No respiratory distress.     Breath sounds: No wheezing.  Abdominal:     General: There is no distension.     Tenderness: There is no abdominal tenderness. There is no guarding or rebound.  Genitourinary:    Comments: Nonbleeding hemorrhoid.  No gross blood.  Soft brown stool. Musculoskeletal:        General: Normal range of motion.     Cervical back: Normal range of motion and neck supple.  Skin:    Coloration: Skin is not pale.     Findings: No rash.  Neurological:     Mental Status: He is alert and oriented to person, place, and time.  Psychiatric:        Behavior: Behavior normal.    ED Results / Procedures / Treatments    Labs (all labs ordered are listed, but only abnormal results are displayed) Labs Reviewed - No data to display  EKG None  Radiology No results found.  Procedures Procedures    Medications Ordered in ED Medications - No data to display  ED Course/ Medical Decision Making/ A&P                           Medical Decision Making  31 yo M with no significant past medical history on record review.  Comes in with a chief complaint of  Blood per rectum for about a month.  He is well-appearing and nontoxic.  I feel no benefit from laboratory evaluation.  He does have a hemorrhoid on exam and is not bleeding.  Likely the cause of his symptoms.  We will have him try topical agents.  Given information on how to prevent hemorrhoids in the future.  Given GI follow-up as needed.  7:44 PM:  I have discussed the diagnosis/risks/treatment options with the patient and believe the pt to be eligible for discharge home to follow-up with PCP. We also discussed returning to the ED immediately if new or worsening sx occur. We discussed the sx which are most concerning (e.g., sudden worsening pain, fever, inability to tolerate by mouth) that necessitate immediate return. Medications administered to the patient during their visit and any new prescriptions provided to the patient are listed below.  Medications given during this visit Medications - No data to display   The patient appears reasonably screen and/or stabilized for discharge and I doubt any other medical condition or other [facility] requiring further screening, evaluation, or treatment in the ED at this time prior to discharge.          Final Clinical Impression(s) / ED Diagnoses Final diagnoses:  Acute hemorrhoid    Rx / DC Orders ED Discharge Orders          Ordered    phenylephrine-shark liver oil-mineral oil-petrolatum (PREPARATION H) 0.25-14-74.9 % rectal ointment  2 times daily PRN        12/23/21 1918              Deno Etienne, DO 12/23/21 1944
